# Patient Record
Sex: Female | Born: 1955 | Race: White | Hispanic: No | Marital: Married | Smoking: Never smoker
Health system: Southern US, Community
[De-identification: ages and names within clinical notes are randomized; demographics above are authoritative.]

## PROBLEM LIST (undated history)

## (undated) DIAGNOSIS — M3500 Sicca syndrome, unspecified: Secondary | ICD-10-CM

## (undated) DIAGNOSIS — M797 Fibromyalgia: Secondary | ICD-10-CM

---

## 2007-03-21 ENCOUNTER — Ambulatory Visit: Payer: Self-pay | Admitting: Internal Medicine

## 2007-07-10 ENCOUNTER — Ambulatory Visit: Payer: Self-pay | Admitting: Family Medicine

## 2008-06-03 ENCOUNTER — Emergency Department: Payer: Self-pay | Admitting: Unknown Physician Specialty

## 2009-02-04 ENCOUNTER — Ambulatory Visit: Payer: Self-pay | Admitting: Internal Medicine

## 2009-02-27 ENCOUNTER — Ambulatory Visit: Payer: Self-pay | Admitting: Family Medicine

## 2010-02-17 IMAGING — CR DG CHEST 2V
1 series · 2 of 2 positions shown · non-contrast
Comparison: none

REASON FOR EXAM: near syncope
COMMENTS:

[Series 1: view not recorded · 0.17mm/px · 2 of 2 slices shown]
[im 1/2]
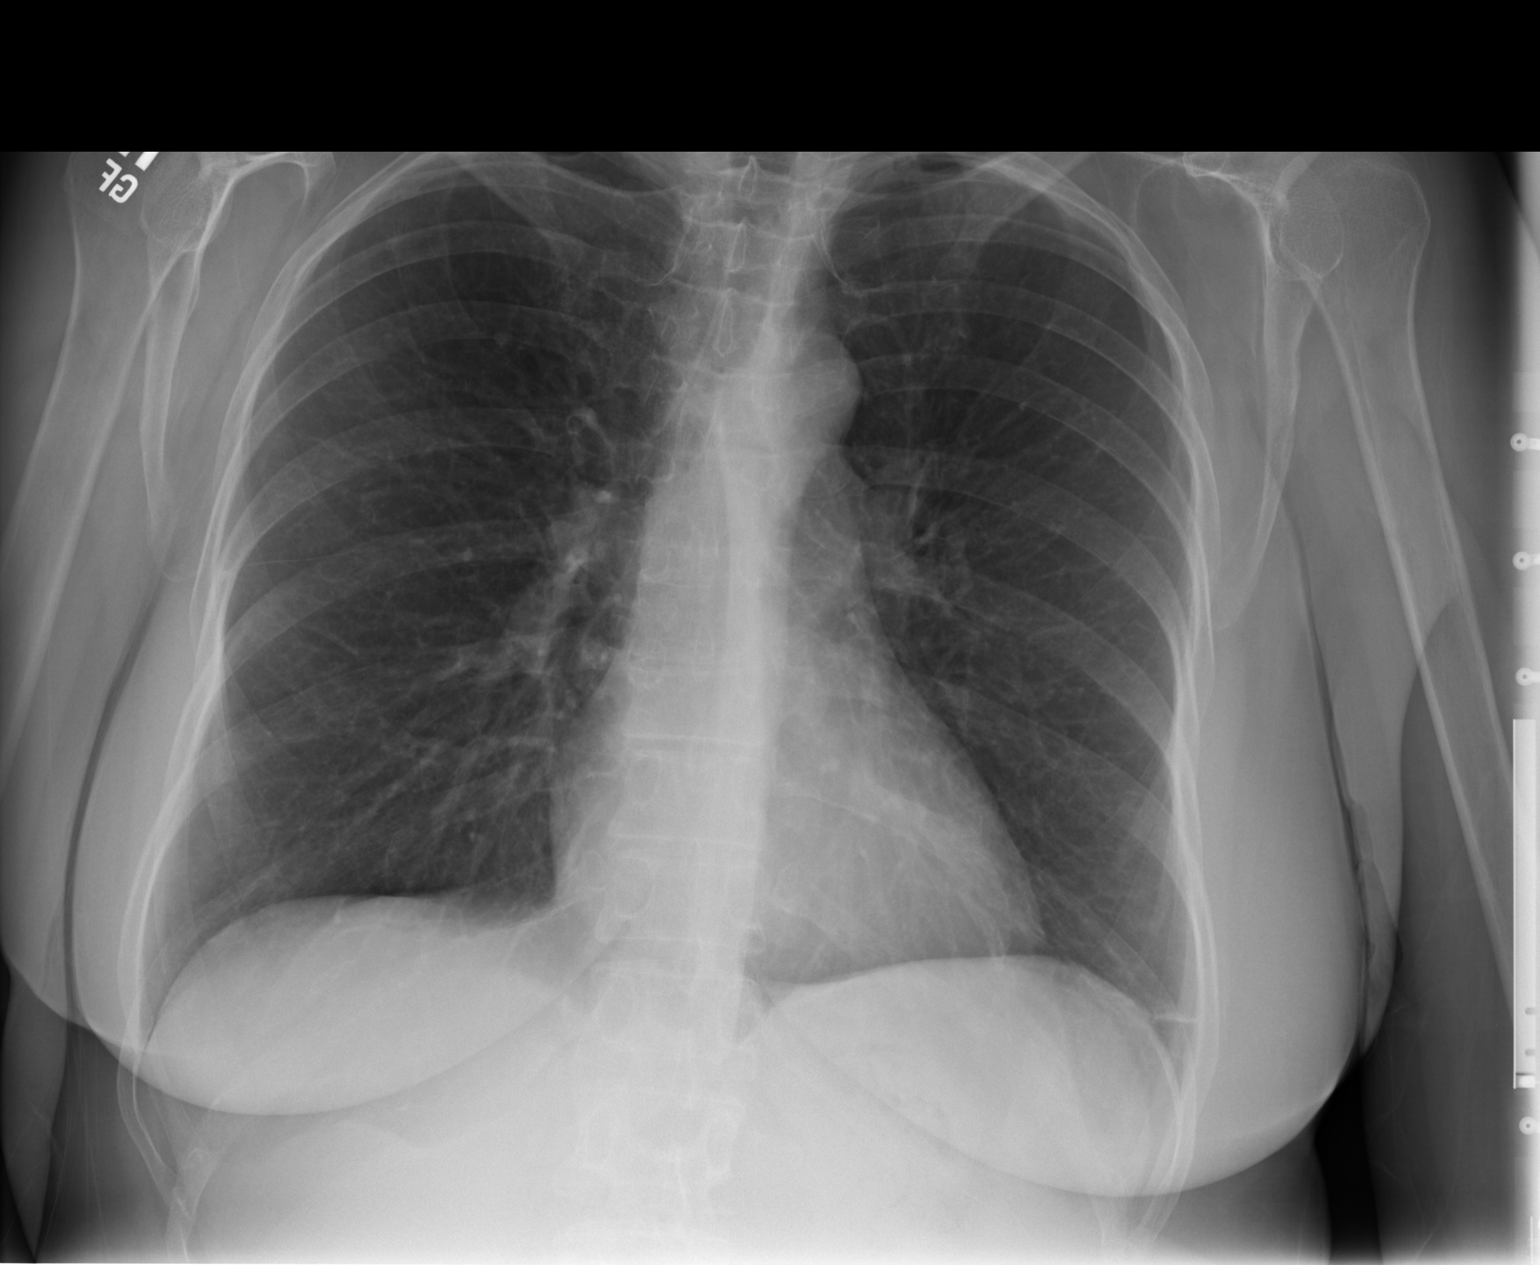
[im 2/2]
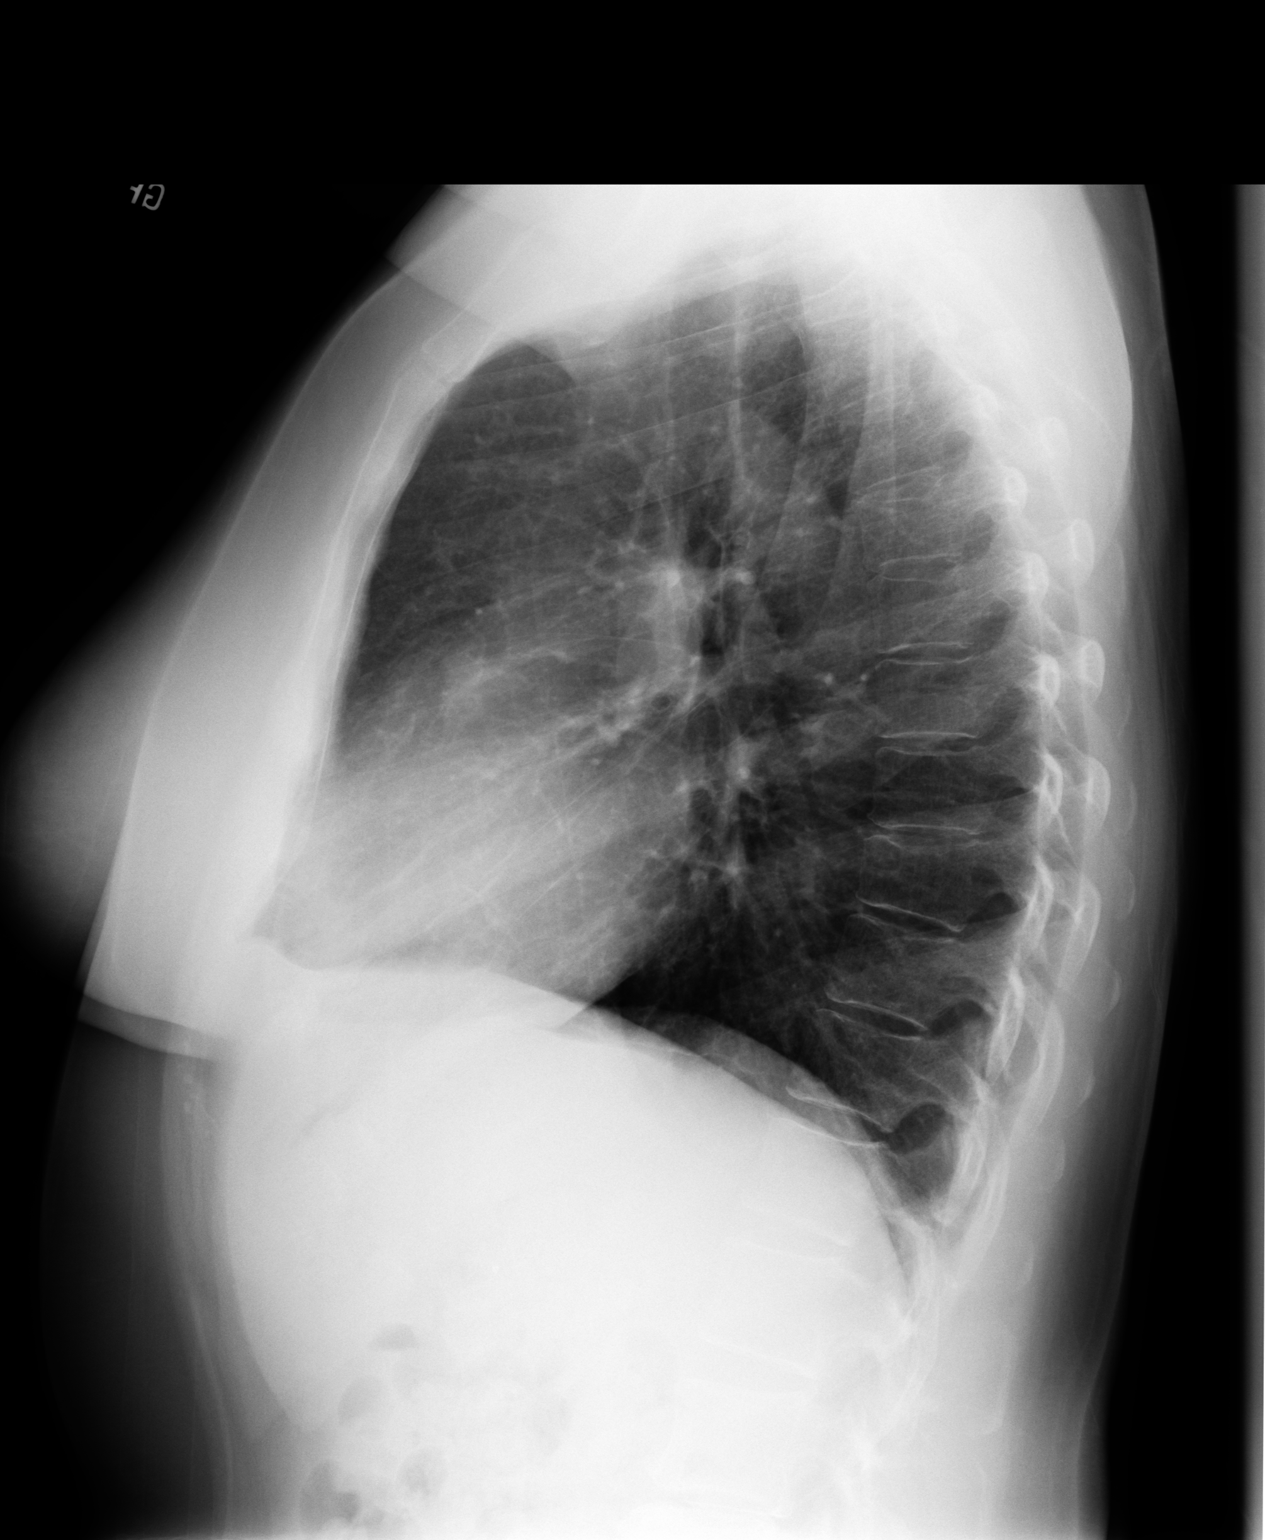

[2 of 2 positions shown; findings below may reference images not displayed]

PROCEDURE:     DXR - DXR CHEST PA (OR AP) AND LATERAL  - June 03, 2008  [DATE]

RESULT:     There is a transverse linear density at the LEFT costophrenic
angle compatible with a fibrotic strand or minimal discoid atelectasis. No
pneumonia, pneumothorax or pleural effusion is seen. Heart size is normal.
IMPRESSION: No acute changes are identified.

## 2014-09-03 ENCOUNTER — Encounter: Payer: Self-pay | Admitting: Emergency Medicine

## 2014-09-03 ENCOUNTER — Ambulatory Visit
Admission: EM | Admit: 2014-09-03 | Discharge: 2014-09-03 | Disposition: A | Discharge status: Home / Self Care | Payer: Medicare Other | Attending: Family Medicine | Admitting: Family Medicine

## 2014-09-03 DIAGNOSIS — S70362A Insect bite (nonvenomous), left thigh, initial encounter: Secondary | ICD-10-CM

## 2014-09-03 DIAGNOSIS — W57XXXA Bitten or stung by nonvenomous insect and other nonvenomous arthropods, initial encounter: Secondary | ICD-10-CM | POA: Diagnosis not present

## 2014-09-03 HISTORY — DX: Fibromyalgia: M79.7

## 2014-09-03 HISTORY — DX: Sjogren syndrome, unspecified: M35.00

## 2014-09-03 MED ORDER — MUPIROCIN CALCIUM 2 % EX CREA: TOPICAL_CREAM | CUTANEOUS | Status: AC

## 2014-09-03 MED ORDER — DOXYCYCLINE HYCLATE 100 MG PO CAPS: 100.0000 mg | ORAL_CAPSULE | Freq: Two times a day (BID) | ORAL | Status: AC

## 2014-09-03 NOTE — ED Notes (Signed)
Tick on left upper thigh.  Been there about 2 days.  Multiple attempts to remove at home without resolve.

## 2014-09-03 NOTE — Discharge Instructions (Signed)
Tick Bite Information Ticks are insects that attach themselves to the skin and draw blood for food. There are various types of ticks. Common types include wood ticks and deer ticks. Most ticks live in shrubs and grassy areas. Ticks can climb onto your body when you make contact with leaves or grass where the tick is waiting. The most common places on the body for ticks to attach themselves are the scalp, neck, armpits, waist, and groin. Most tick bites are harmless, but sometimes ticks carry germs that cause diseases. These germs can be spread to a person during the tick's feeding process. The chance of a disease spreading through a tick bite depends on:   The type of tick.  Time of year.   How long the tick is attached.   Geographic location.  HOW CAN YOU PREVENT TICK BITES? Take these steps to help prevent tick bites when you are outdoors:  Wear protective clothing. Long sleeves and long pants are best.   Wear white clothes so you can see ticks more easily.  Tuck your pant legs into your socks.   If walking on a trail, stay in the middle of the trail to avoid brushing against bushes.  Avoid walking through areas with long grass.  Put insect repellent on all exposed skin and along boot tops, pant legs, and sleeve cuffs.   Check clothing, hair, and skin repeatedly and before going inside.   Brush off any ticks that are not attached.  Take a shower or bath as soon as possible after being outdoors.  WHAT IS THE PROPER WAY TO REMOVE A TICK? Ticks should be removed as soon as possible to help prevent diseases caused by tick bites. 1. If latex gloves are available, put them on before trying to remove a tick.  2. Using fine-point tweezers, grasp the tick as close to the skin as possible. You may also use curved forceps or a tick removal tool. Grasp the tick as close to its head as possible. Avoid grasping the tick on its body. 3. Pull gently with steady upward pressure until  the tick lets go. Do not twist the tick or jerk it suddenly. This may break off the tick's head or mouth parts. 4. Do not squeeze or crush the tick's body. This could force disease-carrying fluids from the tick into your body.  5. After the tick is removed, wash the bite area and your hands with soap and water or other disinfectant such as alcohol. 6. Apply a small amount of antiseptic cream or ointment to the bite site.  7. Wash and disinfect any instruments that were used.  Do not try to remove a tick by applying a hot match, petroleum jelly, or fingernail polish to the tick. These methods do not work and may increase the chances of disease being spread from the tick bite.  WHEN SHOULD YOU SEEK MEDICAL CARE? Contact your health care provider if you are unable to remove a tick from your skin or if a part of the tick breaks off and is stuck in the skin.  After a tick bite, you need to be aware of signs and symptoms that could be related to diseases spread by ticks. Contact your health care provider if you develop any of the following in the days or weeks after the tick bite:  Unexplained fever.  Rash. A circular rash that appears days or weeks after the tick bite may indicate the possibility of Lyme disease. The rash may resemble   a target with a bull's-eye and may occur at a different part of your body than the tick bite.  Redness and swelling in the area of the tick bite.   Tender, swollen lymph glands.   Diarrhea.   Weight loss.   Cough.   Fatigue.   Muscle, joint, or bone pain.   Abdominal pain.   Headache.   Lethargy or a change in your level of consciousness.  Difficulty walking or moving your legs.   Numbness in the legs.   Paralysis.  Shortness of breath.   Confusion.   Repeated vomiting.  Document Released: 03/06/2000 Document Revised: 12/28/2012 Document Reviewed: 08/17/2012 [employer] Patient Information 2015 [employer]. This information is  not intended to replace advice given to you by your health care provider. Make sure you discuss any questions you have with your health care provider.  

## 2014-09-03 NOTE — ED Notes (Signed)
Patient verbally discharged by Anette Riedel, PA-C

## 2014-09-03 NOTE — ED Provider Notes (Signed)
CSN: [id number]     Arrival date & time 09/03/14  1329 History   First MD Initiated Contact with Patient 09/03/14 1405     Chief Complaint  Patient presents with  . Tick Removal   (Consider location/radiation/quality/duration/timing/severity/associated sxs/prior Treatment) HPI  Tick bite to left medial thigh 2 days ago- pulled it loose with a spot of blood noted and thought she had the whole thing.  Her husband checked it and then attempted to "dig out the head" with home bathroom scissors last night. Area has become irritated and mildly inflamed. Her brother has had Lymes disease with significant sequela and they are concerned.No fever. No particular pain. No rash  Past Medical History  Diagnosis Date  . Fibromyalgia   . Sjoegren syndrome    History reviewed. No pertinent past surgical history. Family History  Problem Relation Age of Onset  . Rheum arthritis Mother   . Lupus Mother   . Stroke Father   . Hypertension Father    History  Substance Use Topics  . Smoking status: Never Smoker   . Smokeless tobacco: Never Used  . Alcohol Use: No   OB History    No data available     Review of Systems Review of 10 systems negative for acute change except as referenced in HPI  Allergies  Sulfa antibiotics  Home Medications   Prior to Admission medications   Medication Sig Start Date End Date Taking? Authorizing Provider  buPROPion (WELLBUTRIN XL) 150 MG 24 hr tablet Take 150 mg by mouth daily.   Yes Historical Provider, MD  DULoxetine (CYMBALTA) 60 MG capsule Take 60 mg by mouth daily.   Yes Historical Provider, MD  hydroxychloroquine (PLAQUENIL) 200 MG tablet Take 400 mg by mouth daily.   Yes Historical Provider, MD  rOPINIRole (REQUIP) 0.5 MG tablet Take 0.5 mg by mouth daily.   Yes Historical Provider, MD  zonisamide (ZONEGRAN) 100 MG capsule Take 200 mg by mouth daily.   Yes Historical Provider, MD  doxycycline (VIBRAMYCIN) 100 MG capsule Take 1 capsule (100 mg total) by  mouth 2 (two) times daily. 09/03/14 09/13/14  Rae Halsted, PA-C  mupirocin cream Idelle Jo) 2 % Please substitute Rx with ointment- apply BID/TID 09/03/14   Rae Halsted, PA-C   BP 126/51 mmHg  Pulse 88  Temp(Src) 97.6 F (36.4 C) (Oral)  Resp 16  Ht 5\' 7"  (1.702 m)  Wt 168 lb (76.204 kg)  BMI 26.31 kg/m2  SpO2 100% Physical Exam Constitutional -alert and oriented,well appearing and in no acute distress Head-atraumatic Eyes- conjunctiva normal, EOMI ,conjugate gaze- has irritation from pollen Nose- no congestion or rhinorrhea Mouth/throat- mucous membranes moist , Neck- supple CV- regular rate, Resp-no distress, normal respiratory effort, GI-r,no distention GU- not examined MSK- left medial thigh with single erythematous lesion- central dark spot appears to be foreign body- betadine scrub- 18 g needle , forceps and mosquito -removed in fragments. 1% lidocaine 1cc to extract deepest fragment with forceps and point of #11 blade. Well tolerated by patient Neuro- normal speech and language, no gross focal neurological deficit appreciated, no gait instability Skin-warm,dry ,see HPI Psych-mood and affect grossly normal; speech and behavior grossly normal ED Course  Procedures (including critical care time) Labs Review Labs Reviewed - No data to display  Imaging Review No results found.   MDM   1. Tick bite of thigh with local reaction, left, initial encounter     Discharge Medication List as of 09/03/2014  3:32 PM  START taking these medications   Details  doxycycline (VIBRAMYCIN) 100 MG capsule Take 1 capsule (100 mg total) by mouth 2 (two) times daily., Starting 09/03/2014, Until Thu 09/13/14, Normal    mupirocin cream (BACTROBAN) 2 % Please substitute Rx with ointment- apply BID/TID, Normal       Diagnosis and treatment discussed.  Questions fielded, expectations and recommendations reviewed. Patient expresses understanding. Will return to [facility] with questions, concern or  exacerbation.  Informational handout reviewed and given to patient. Return with fever, rash , increased malaise or other concerns  Rae Halsted, PA-C 09/03/14 1555

## 2015-06-25 ENCOUNTER — Ambulatory Visit: Payer: Medicare Other

## 2015-06-25 ENCOUNTER — Ambulatory Visit
Admission: EM | Admit: 2015-06-25 | Discharge: 2015-06-25 | Disposition: A | Discharge status: Home / Self Care | Payer: Medicare Other | Attending: Family Medicine | Admitting: Family Medicine

## 2015-06-25 ENCOUNTER — Encounter: Payer: Self-pay | Admitting: *Deleted

## 2015-06-25 DIAGNOSIS — S9032XA Contusion of left foot, initial encounter: Secondary | ICD-10-CM | POA: Insufficient documentation

## 2015-06-25 DIAGNOSIS — S92355A Nondisplaced fracture of fifth metatarsal bone, left foot, initial encounter for closed fracture: Secondary | ICD-10-CM | POA: Diagnosis not present

## 2015-06-25 DIAGNOSIS — M3500 Sicca syndrome, unspecified: Secondary | ICD-10-CM | POA: Insufficient documentation

## 2015-06-25 DIAGNOSIS — W109XXA Fall (on) (from) unspecified stairs and steps, initial encounter: Secondary | ICD-10-CM | POA: Insufficient documentation

## 2015-06-25 DIAGNOSIS — S92302A Fracture of unspecified metatarsal bone(s), left foot, initial encounter for closed fracture: Secondary | ICD-10-CM

## 2015-06-25 DIAGNOSIS — M25572 Pain in left ankle and joints of left foot: Secondary | ICD-10-CM | POA: Insufficient documentation

## 2015-06-25 DIAGNOSIS — Z79899 Other long term (current) drug therapy: Secondary | ICD-10-CM | POA: Diagnosis not present

## 2015-06-25 DIAGNOSIS — M797 Fibromyalgia: Secondary | ICD-10-CM | POA: Diagnosis not present

## 2015-06-25 DIAGNOSIS — G8929 Other chronic pain: Secondary | ICD-10-CM | POA: Insufficient documentation

## 2015-06-25 DIAGNOSIS — Z888 Allergy status to other drugs, medicaments and biological substances status: Secondary | ICD-10-CM | POA: Diagnosis not present

## 2015-06-25 MED ORDER — IBUPROFEN 800 MG PO TABS: 800.0000 mg | ORAL_TABLET | Freq: Three times a day (TID) | ORAL | Status: AC | PRN

## 2015-06-25 MED ORDER — BACITRACIN ZINC 500 UNIT/GM EX OINT: TOPICAL_OINTMENT | Freq: Two times a day (BID) | CUTANEOUS | Status: DC

## 2015-06-25 MED ORDER — OXYCODONE-ACETAMINOPHEN 5-325 MG PO TABS: 1.0000 | ORAL_TABLET | Freq: Three times a day (TID) | ORAL | Status: AC | PRN

## 2015-06-25 MED ORDER — TETANUS-DIPHTH-ACELL PERTUSSIS 5-2.5-18.5 LF-MCG/0.5 IM SUSP
0.5000 mL | Freq: Once | INTRAMUSCULAR | Status: AC
  Administered 2015-06-25: 0.5 mL via INTRAMUSCULAR

## 2015-06-25 NOTE — Discharge Instructions (Signed)
Take medication as prescribed. Rest. Apply ice and elevate. Clean abrasion daily with soap and water and apply thin layer of topical antibiotic ointment such as neosporin. No weight bearing.   Follow up with podiatry as discussed this week. See above to call to schedule.   Follow up with your primary care physician this week as needed. Return to Urgent care for new or worsening concerns.    Metatarsal Fracture A metatarsal fracture is a break in a metatarsal bone. Metatarsal bones connect your toe bones to your ankle bones. CAUSES This type of fracture may be caused by:  A sudden twisting of your foot.  A fall onto your foot.  Overuse or repetitive exercise. RISK FACTORS This condition is more likely to develop in people who:  Play contact sports.  Have a bone disease.  Have a low calcium level. SYMPTOMS Symptoms of this condition include:  Pain that is worse when walking or standing.  Pain when pressing on the foot or moving the toes.  Swelling.  Bruising on the top or bottom of the foot.  A foot that appears shorter than the other one. DIAGNOSIS This condition is diagnosed with a physical exam. You may also have imaging tests, such as:  X-rays.  A CT scan.  MRI. TREATMENT Treatment for this condition depends on its severity and whether a bone has moved out of place. Treatment may involve:  Rest.  Wearing foot support such as a cast, splint, or boot for several weeks.  Using crutches.  Surgery to move bones back into the right position. Surgery is usually needed if there are many pieces of broken bone or bones that are very out of place (displaced fracture).  Physical therapy. This may be needed to help you regain full movement and strength in your foot. You will need to return to your health care provider to have X-rays taken until your bones heal. Your health care provider will look at the X-rays to make sure that your foot is healing well. HOME CARE  INSTRUCTIONS  If You Have a Cast:  Do not stick anything inside the cast to scratch your skin. Doing that increases your risk of infection.  Check the skin around the cast every day. Report any concerns to your health care provider. You may put lotion on dry skin around the edges of the cast. Do not apply lotion to the skin underneath the cast.  Keep the cast clean and dry. If You Have a Splint or a Supportive Boot:  Wear it as directed by your health care provider. Remove it only as directed by your health care provider.  Loosen it if your toes become numb and tingle, or if they turn cold and blue.  Keep it clean and dry. Bathing  Do not take baths, swim, or use a hot tub until your health care provider approves. Ask your health care provider if you can take showers. You may only be allowed to take sponge baths for bathing.  If your health care provider approves bathing and showering, cover the cast or splint with a watertight plastic bag to protect it from water. Do not let the cast or splint get wet. Managing Pain, Stiffness, and Swelling  If directed, apply ice to the injured area (if you have a splint, not a cast).  Put ice in a plastic bag.  Place a towel between your skin and the bag.  Leave the ice on for 20 minutes, 2-3 times per day.  Move  your toes often to avoid stiffness and to lessen swelling.  Raise (elevate) the injured area above the level of your heart while you are sitting or lying down. Driving  Do not drive or operate heavy machinery while taking pain medicine.  Do not drive while wearing foot support on a foot that you use for driving. Activity  Return to your normal activities as directed by your health care provider. Ask your health care provider what activities are safe for you.  Perform exercises as directed by your health care provider or physical therapist. Safety  Do not use the injured foot to support your body weight until your health care  provider says that you can. Use crutches as directed by your health care provider. General Instructions  Do not put pressure on any part of the cast or splint until it is fully hardened. This may take several hours.  Do not use any tobacco products, including cigarettes, chewing tobacco, or e-cigarettes. Tobacco can delay bone healing. If you need help quitting, ask your health care provider.  Take medicines only as directed by your health care provider.  Keep all follow-up visits as directed by your health care provider. This is important. SEEK MEDICAL CARE IF:  You have a fever.  Your cast, splint, or boot is too loose or too tight.  Your cast, splint, or boot is damaged.  Your pain medicine is not helping.  You have pain, tingling, or numbness in your foot that is not going away. SEEK IMMEDIATE MEDICAL CARE IF:  You have severe pain.  You have tingling or numbness in your foot that is getting worse.  Your foot feels cold or becomes numb.  Your foot changes color.   This information is not intended to replace advice given to you by your health care provider. Make sure you discuss any questions you have with your health care provider.   Document Released: 11/29/2001 Document Revised: 07/24/2014 Document Reviewed: 01/03/2014 Elsevier Interactive Patient Education 2016 [employer]  Contusion A contusion is a deep bruise. Contusions happen when an injury causes bleeding under the skin. Symptoms of bruising include pain, swelling, and discolored skin. The skin may turn blue, purple, or yellow. HOME CARE   Rest the injured area.  If told, put ice on the injured area.  Put ice in a plastic bag.  Place a towel between your skin and the bag.  Leave the ice on for 20 minutes, 2-3 times per day.  If told, put light pressure (compression) on the injured area using an elastic bandage. Make sure the bandage is not too tight. Remove it and put it back on as told by your  doctor.  If possible, raise (elevate) the injured area above the level of your heart while you are sitting or lying down.  Take over-the-counter and prescription medicines only as told by your doctor. GET HELP IF:  Your symptoms do not get better after several days of treatment.  Your symptoms get worse.  You have trouble moving the injured area. GET HELP RIGHT AWAY IF:   You have very bad pain.  You have a loss of feeling (numbness) in a hand or foot.  Your hand or foot turns pale or cold.   This information is not intended to replace advice given to you by your health care provider. Make sure you discuss any questions you have with your health care provider.   Document Released: 08/26/2007 Document Revised: 11/28/2014 Document Reviewed: 07/25/2014 Elsevier Interactive Patient  Education ©2016 [employer] ° °

## 2015-06-25 NOTE — ED Notes (Signed)
Pt tripped and fell landing on left foot. C/o pain and edema to left ankle with an abrasion to left calf region. Edema and discoloration to left lateral malleolus. Unable to bear weight.

## 2015-06-25 NOTE — ED Provider Notes (Signed)
[facility]  ____________________________________________  Time seen: Approximately 7:24 PM  I have reviewed the triage vital signs and the nursing notes.   HISTORY  Chief Complaint Ankle Pain and Abrasion   HPI Beth Oconnell is a 60 y.o. female presents with a complaint of left foot pain post injury. Patient reports that this afternoon approximate 3 PM she was showing a house to a client and states there were old wooden front steps. Patient states that as she stepped down on the bottom step it gave way some causing her to roll her left ankle and fall. Denies head injury or loss of consciousness. Denies other pain or injury. Patient reports that she has not been able to comfortably apply weight to her left foot since the fall. Patient reports history of "weak in" and has fractured left ankle several times in the past. Patient states pain is to her left lateral ankle and foot. Denies other pain. Denies any numbness or tingling sensation. Denies any pain radiation. Denies any calf or shin pain. Patient does report that she did scrape her left calf on the steps but denies pain in that area. Unsure of last tetanus immunization. States current pain is 6 out of 10 aching and worse with ambulation.  Denies headache, head injury, loss of conscious. Denies neck pain, back pain, neck or back injury. Denies other extremity pain or injury. Denies chest pain or shortness of breath, dizziness, weakness, abdominal pain, rash, dysuria or abdominal pain.   Patient reports that she does have chronic history of fibromyalgia and has chronic pain in bilateral shins and lower legs but states currently she does not have any pain in these areas.   Past Medical History  Diagnosis Date  . Fibromyalgia   . Sjoegren syndrome (HCC)     There are no active problems to display for this patient.   History reviewed. No pertinent past surgical history.  Current Outpatient Rx  Name  Route  Sig   Dispense  Refill  . buPROPion (WELLBUTRIN XL) 150 MG 24 hr tablet   Oral   Take 150 mg by mouth daily.         . DULoxetine (CYMBALTA) 60 MG capsule   Oral   Take 60 mg by mouth daily.         . hydroxychloroquine (PLAQUENIL) 200 MG tablet   Oral   Take 400 mg by mouth daily.         Marland Kitchen. rOPINIRole (REQUIP) 0.5 MG tablet   Oral   Take 0.5 mg by mouth daily.         . verapamil (CALAN) 40 MG tablet   Oral   Take 40 mg by mouth 3 (three) times daily.         . mupirocin cream (BACTROBAN) 2 %      Please substitute Rx with ointment- apply BID/TID   15 g   0   . zonisamide (ZONEGRAN) 100 MG capsule   Oral   Take 200 mg by mouth daily.           Allergies Gabapentin and Sulfa antibiotics  Family History  Problem Relation Age of Onset  . Rheum arthritis Mother   . Lupus Mother   . Stroke Father   . Hypertension Father     Social History Social History  Substance Use Topics  . Smoking status: Never Smoker   . Smokeless tobacco: Never Used  . Alcohol Use: No    Review of Systems  Constitutional: No fever/chills Eyes: No visual changes. ENT: No sore throat. Cardiovascular: Denies chest pain. Respiratory: Denies shortness of breath. Gastrointestinal: No abdominal pain.  No nausea, no vomiting.  No diarrhea.  No constipation. Genitourinary: Negative for dysuria. Musculoskeletal: Negative for back pain.Positive left foot and ankle pain. Skin: Negative for rash. Neurological: Negative for headaches, focal weakness or numbness.  10-point ROS otherwise negative.  ____________________________________________   PHYSICAL EXAM:  VITAL SIGNS: ED Triage Vitals  Enc Vitals Group     BP 06/25/15 1729 130/84 mmHg     Pulse Rate 06/25/15 1729 81     Resp 06/25/15 1729 16     Temp 06/25/15 1729 98.6 F (37 C)     Temp Source 06/25/15 1729 Oral     SpO2 06/25/15 1729 97 %     Weight 06/25/15 1729 165 lb (74.844 kg)     Height 06/25/15 1729  (1.727  m)     Head Cir --      Peak Flow --      Pain Score 06/25/15 1736 7     Pain Loc --      Pain Edu? --      Excl. in GC? --     Constitutional: Alert and oriented. Well appearing and in no acute distress. Eyes: Conjunctivae are normal. PERRL. EOMI. Head: Atraumatic.Nontender. No ecchymosis or erythema.  Nose: No congestion/rhinnorhea.  Mouth/Throat: Mucous membranes are moist.  Oropharynx non-erythematous. Neck: No stridor.  No cervical spine tenderness to palpation. Hematological/Lymphatic/Immunilogical: No cervical lymphadenopathy. Cardiovascular: Normal rate, regular rhythm. Grossly normal heart sounds.  Good peripheral circulation. Respiratory: Normal respiratory effort.  No retractions. Lungs CTAB. Gastrointestinal: Soft and nontender. No distention. Normal Bowel sounds. No CVA tenderness. Musculoskeletal: No lower or upper extremity tenderness nor edema.  No cervical, thoracic or lumbar tenderness to palpation. Bilateral pedal pulses equal and easily palpated.  Except: Left lateral proximal fifth metatarsal and mid foot mild to moderate tenderness to palpation with mild swelling and mild ecchymosis, full range of motion to left foot and left ankle, left lateral ankle nontender to palpation, left foot otherwise nontender, left foot all distal toes capillary refill less than 2 seconds, sensation intact to all left foot distal toes. Left lower extremity otherwise nontender. Gait not tested due to pain.  Neurologic:  Normal speech and language. No gross focal neurologic deficits are appreciated.  Skin:  Skin is warm, dry and intact. No rash noted. Except:  left anterior lateral lower shin with superficial skin tear, nontender, no foreign bodies visualized, no erythema surrounding, no ecchymosis, no exudate or drainage, no active bleeding, no repair indicated. Psychiatric: Mood and affect are normal. Speech and behavior are normal.  ____________________________________________    LABS (all labs ordered are listed, but only abnormal results are displayed)  Labs Reviewed - No data to display  RADIOLOGY  EXAM: LEFT ANKLE COMPLETE - 3+ VIEW  COMPARISON: None.  FINDINGS: There is soft tissue swelling over the lateral malleolus. There appears be a nondisplaced fracture through the tip of the lateral malleolus. There is an acute nondisplaced fracture of the base of fifth metatarsal. No other fracture is identified. Plantar calcaneal spur is noted.  IMPRESSION: Acute nondisplaced fracture base of the fifth metatarsal.  Possible nondisplaced fracture of the tip of the lateral malleolus.   Electronically Signed By: Drusilla Kanner M.D. On: 06/25/2015 18:23  I, Renford Dills, personally viewed and evaluated these images (plain radiographs) as part of my medical decision making, as well as  reviewing the written report by the radiologist.  ____________________________________________   PROCEDURES  Procedure(s) performed:  Left posterior foot OCL splint applied by RN. Neurovascular intact post application. Crutches given. __________________________________________   INITIAL IMPRESSION / ASSESSMENT AND PLAN / ED COURSE  Pertinent labs & imaging results that were available during my care of the patient were reviewed by me and considered in my medical decision making (see chart for details).  Very well-appearing patient. No acute distress. Presenting with husband at bedside for complaints of left foot and ankle pain post mechanical fall prior to arrival. Patient denies Worker's Compensation injury. Patient reports that she fell as a step shifted causing her to roll her left ankle and fall. No focal neurological deficits. Denies head injury or loss consciousness. Point tenderness in reproducible pain to proximal fifth metatarsal and lateral midfoot. Left lateral malleolus nontender. Left lower lateral shin skin tear no repair indicated. Wound cleaned with  Betadine and saline by RN and topical antibiotic bacitracin and dressing applied. Will evaluate x-ray. Left foot otherwise nontender. Left lower extremity otherwise nontender. Tetanus updated.   Left ankle x-ray acute nondisplaced fracture at the base of the fifth metatarsal and per radiologist possible nondisplaced fracture the tip of the lateral malleolus. Patient is point tender at the proximal fifth metatarsal however noted tenderness of the lateral malleolus tip. Patient reports history of previous left ankle fractures. Suspect left lateral malleoli are abnormality is chronic and nonacute. Left foot otherwise nontender.  Will treat left fifth metatarsal fracture with posterior OCL splint for initial mobilization and recommend patient to remain nonweightbearing, ice, and elevation. Recommend patient to follow-up with podiatry within 1 week. Will treat with when necessary ibuprofen and Percocet as patient tolerates this in the past well per patient.Discussed indication, risks and benefits of medications with patient.  Discussed follow up with Primary care physician this week. Discussed follow up and return parameters including no resolution or any worsening concerns. Patient verbalized understanding and agreed to plan.   ____________________________________________   FINAL CLINICAL IMPRESSION(S) / ED DIAGNOSES  Final diagnoses:  Fracture of fifth metatarsal bone, left, closed, initial encounter  Foot contusion, left, initial encounter      Note: This dictation was prepared with Dragon dictation along with smaller phrase technology. Any transcriptional errors that result from this process are unintentional.    Renford Dills, NP 06/25/15 2226

## 2017-03-10 IMAGING — CR DG ANKLE COMPLETE 3+V*L*
4 series · 4 of 4 positions shown · non-contrast
Comparison: None.

CLINICAL DATA: Status post trip and fall today. Left ankle pain and
swelling. Initial encounter.

EXAM:
LEFT ANKLE COMPLETE - 3+ VIEW

[ankle ap]
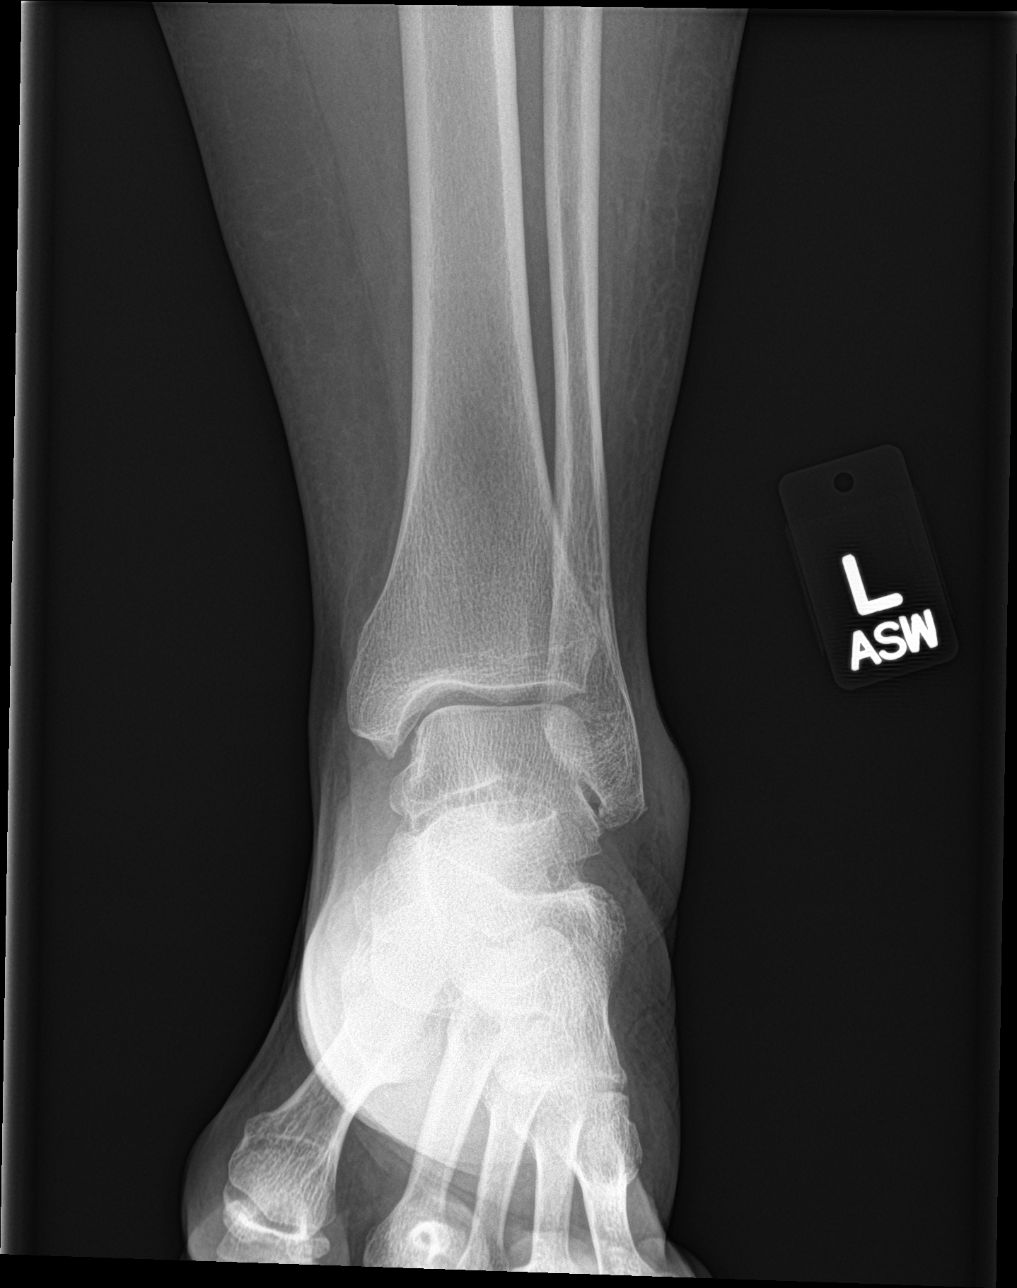

[ankle obl (1 of 2)]
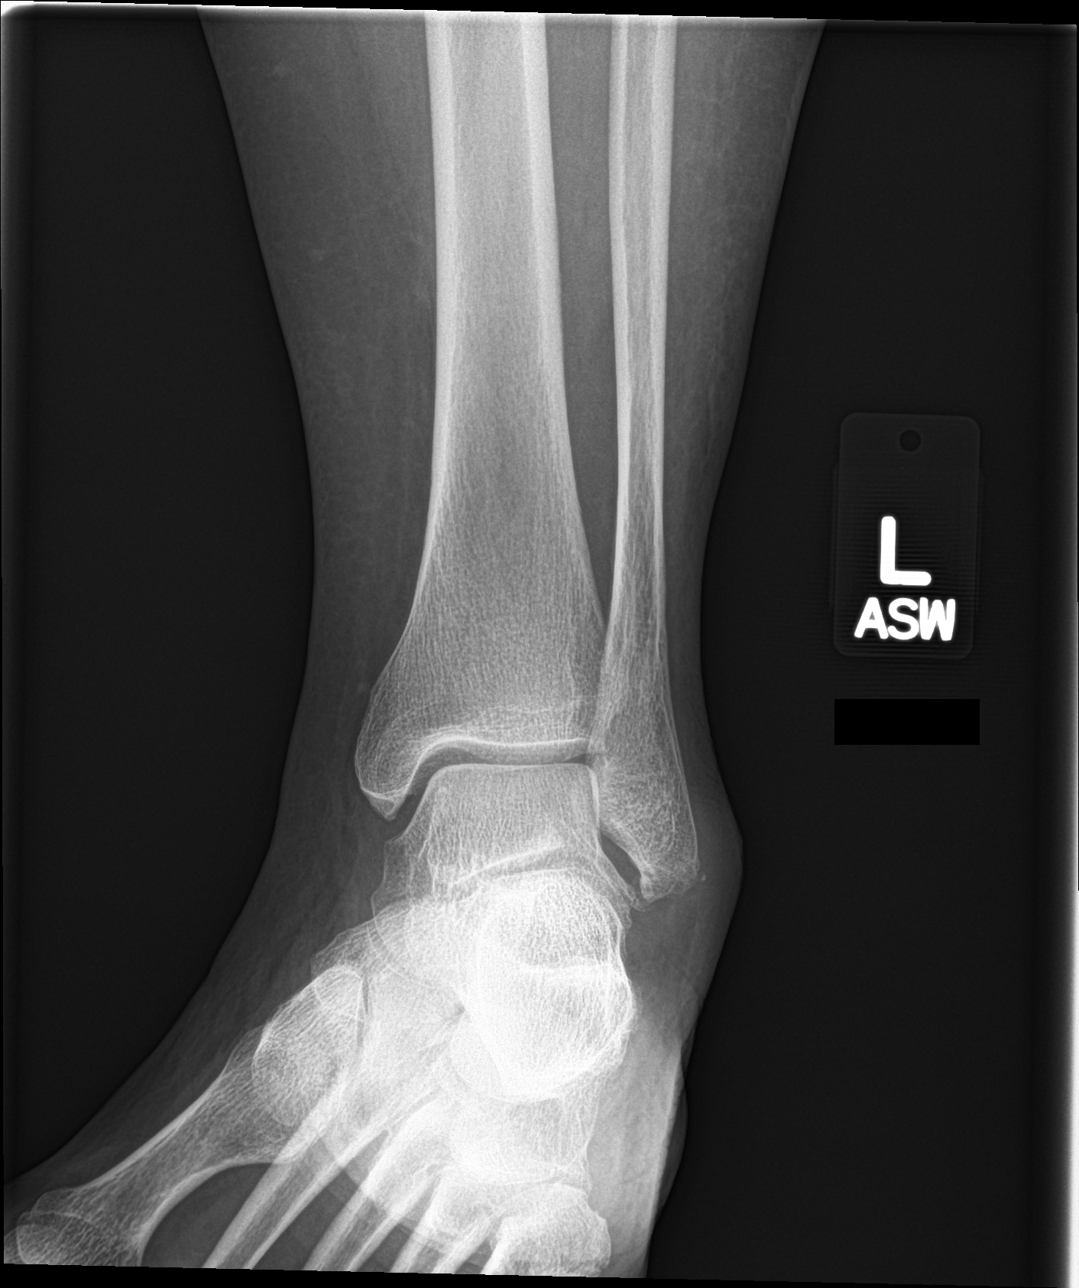

[ankle lat]
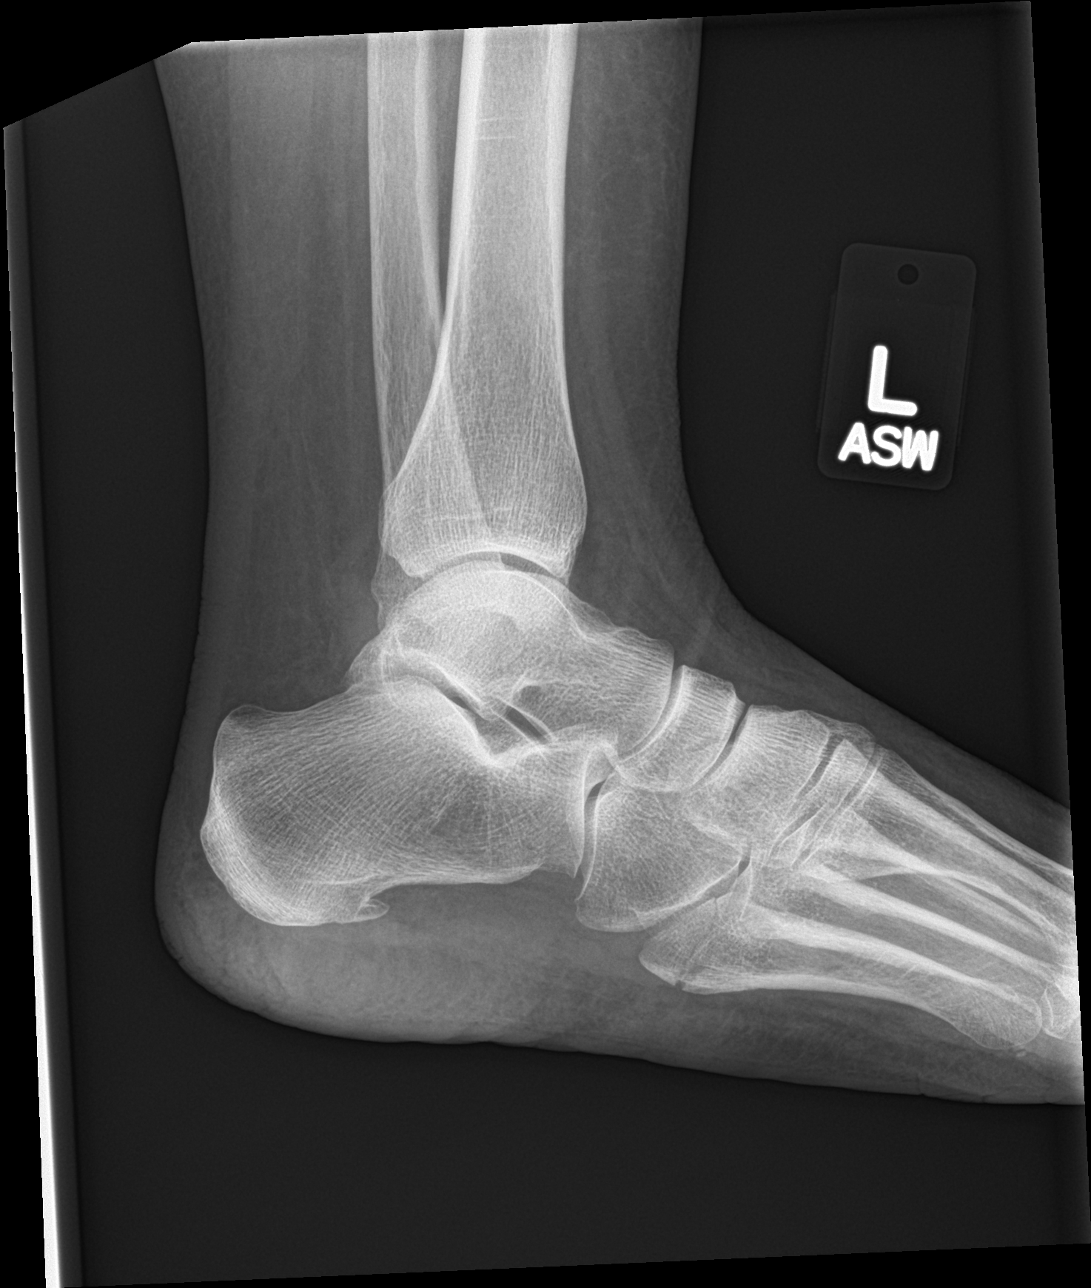

[ankle obl (2 of 2)]
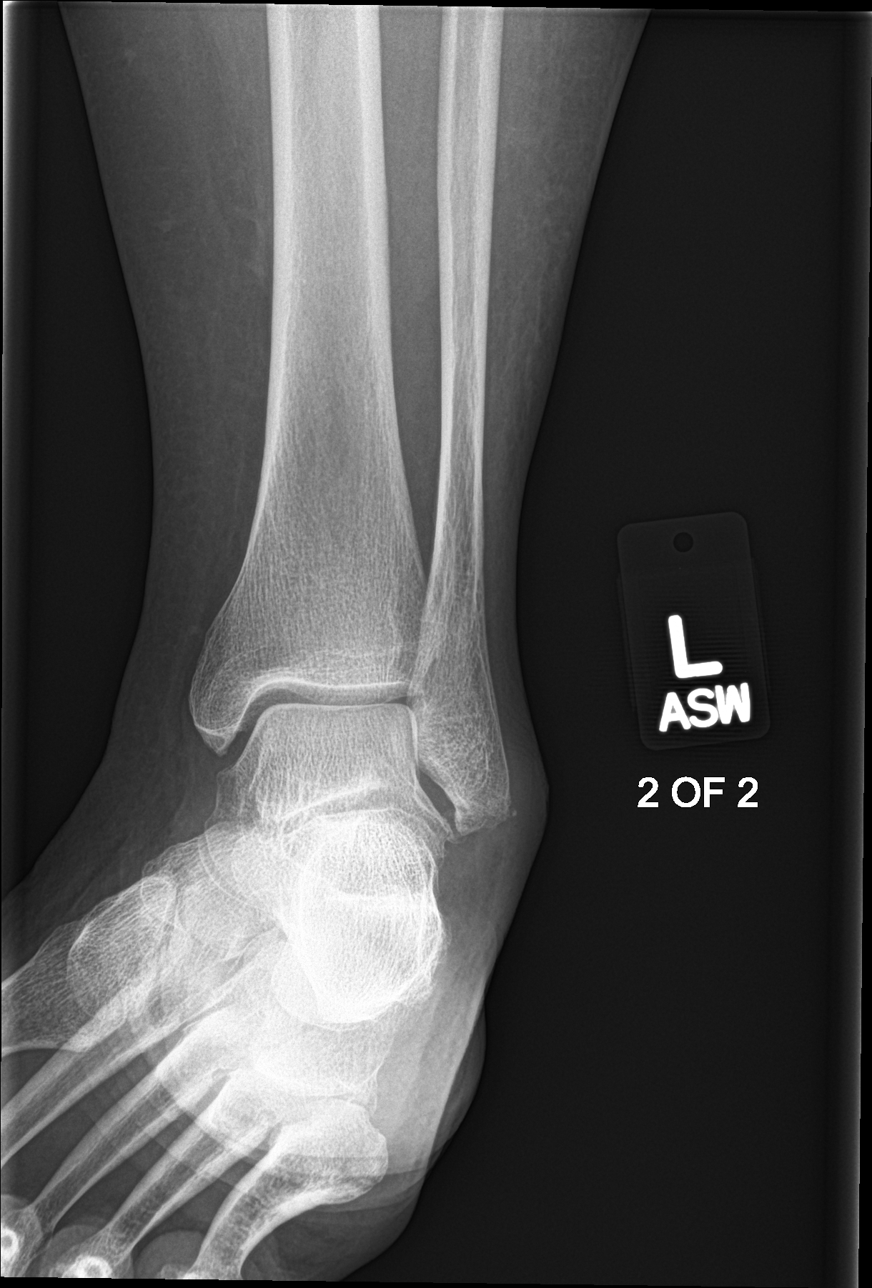

[4 of 4 positions shown; findings below may reference images not displayed]

FINDINGS: There is soft tissue swelling over the lateral malleolus. There
appears be a nondisplaced fracture through the tip of the lateral
malleolus. There is an acute nondisplaced fracture of the base of
fifth metatarsal. No other fracture is identified. Plantar calcaneal
spur is noted.
IMPRESSION: Acute nondisplaced fracture base of the fifth metatarsal.

Possible nondisplaced fracture of the tip of the lateral malleolus.
